# Patient Record
Sex: Male | Born: 2000 | Race: White | Hispanic: No | Marital: Single | Smoking: Never smoker
Health system: Southern US, Community
[De-identification: ages and names within clinical notes are randomized; demographics above are authoritative.]

---
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
## 2020-01-02 ENCOUNTER — Emergency Department (HOSPITAL_COMMUNITY)
Admission: EM | Admit: 2020-01-02 | Discharge: 2020-01-02 | Disposition: A | Discharge status: Home / Self Care | Payer: BC Managed Care – PPO | Attending: Emergency Medicine | Admitting: Emergency Medicine

## 2020-01-02 ENCOUNTER — Other Ambulatory Visit: Payer: Self-pay

## 2020-01-02 ENCOUNTER — Encounter (HOSPITAL_COMMUNITY): Payer: Self-pay | Admitting: Emergency Medicine

## 2020-01-02 DIAGNOSIS — J029 Acute pharyngitis, unspecified: Secondary | ICD-10-CM | POA: Diagnosis present

## 2020-01-02 DIAGNOSIS — J020 Streptococcal pharyngitis: Secondary | ICD-10-CM | POA: Diagnosis not present

## 2020-01-02 MED ORDER — METHYLPREDNISOLONE SODIUM SUCC 125 MG IJ SOLR
125.0000 mg | Freq: Once | INTRAMUSCULAR | Status: AC
  Administered 2020-01-02: 125 mg via INTRAMUSCULAR
  Filled 2020-01-02: qty 2

## 2020-01-02 MED ORDER — CEFTRIAXONE SODIUM 1 G IJ SOLR
1.0000 g | Freq: Once | INTRAMUSCULAR | Status: AC
  Administered 2020-01-02: 13:00:00 1 g via INTRAMUSCULAR
  Filled 2020-01-02: qty 10

## 2020-01-02 MED ORDER — LIDOCAINE HCL (PF) 1 % IJ SOLN
INTRAMUSCULAR | Status: AC
  Administered 2020-01-02: 2 mL
  Filled 2020-01-02: qty 2

## 2020-01-02 MED ORDER — CEPHALEXIN 500 MG PO CAPS: 500.0000 mg | ORAL_CAPSULE | Freq: Four times a day (QID) | ORAL | 0 refills | Status: AC

## 2020-01-02 NOTE — ED Triage Notes (Signed)
Patient was seen on at [facility] on Friday and prescribed antibiotics for a sore throat. Patient complains that fevers persist and throat still hurts.

## 2020-01-02 NOTE — ED Provider Notes (Signed)
[facility] EMERGENCY DEPARTMENT Provider Note   CSN: [id number] Arrival date & time: 01/02/20  1034     History Chief Complaint  Patient presents with  . Sore Throat    Donald Larsen is a 19 y.o. male.  The history is provided by the patient. No language interpreter was used.  Sore Throat This is a new problem. The current episode started more than 2 days ago. The problem occurs constantly. The problem has been gradually worsening. Pertinent negatives include no chest pain and no abdominal pain. Nothing aggravates the symptoms. Nothing relieves the symptoms. The treatment provided moderate relief.  Pt complains of a sore throat.  Pt started on clindamycin at Urgent care.  Pt complains of continued swelling and pain     History reviewed. No pertinent past medical history.  There are no problems to display for this patient.   History reviewed. No pertinent surgical history.     No family history on file.  Social History   Tobacco Use  . Smoking status: Never Smoker  . Smokeless tobacco: Never Used  Substance Use Topics  . Alcohol use: Not on file  . Drug use: Not on file    Home Medications Prior to Admission medications   Medication Sig Start Date End Date Taking? Authorizing Provider  cephALEXin (KEFLEX) 500 MG capsule Take 1 capsule (500 mg total) by mouth 4 (four) times daily for 10 days. 01/02/20 01/12/20  Fransico Meadow, PA-C    Allergies    Amoxicillin and Penicillins  Review of Systems   Review of Systems  Cardiovascular: Negative for chest pain.  Gastrointestinal: Negative for abdominal pain.  All other systems reviewed and are negative.   Physical Exam Updated Vital Signs BP 124/89 (BP Location: Right Arm)   Pulse 86   Temp 98.4 F (36.9 C) (Oral)   Resp 16   Ht 5\' 6"  (1.676 m)   Wt 61.2 kg   SpO2 100%   BMI 21.78 kg/m   Physical Exam Vitals and nursing note reviewed.  Constitutional:      Appearance: He is well-developed.    HENT:     Head: Normocephalic and atraumatic.     Right Ear: Tympanic membrane normal.     Left Ear: Tympanic membrane normal.     Mouth/Throat:     Pharynx: Oropharyngeal exudate and posterior oropharyngeal erythema present.     Tonsils: 2+ on the right. 2+ on the left.  Eyes:     Conjunctiva/sclera: Conjunctivae normal.  Cardiovascular:     Rate and Rhythm: Normal rate and regular rhythm.     Heart sounds: No murmur.  Pulmonary:     Effort: Pulmonary effort is normal. No respiratory distress.     Breath sounds: Normal breath sounds.  Abdominal:     Palpations: Abdomen is soft.     Tenderness: There is no abdominal tenderness.  Musculoskeletal:     Cervical back: Neck supple.  Skin:    General: Skin is warm and dry.  Neurological:     General: No focal deficit present.     Mental Status: He is alert.     ED Results / Procedures / Treatments   Labs (all labs ordered are listed, but only abnormal results are displayed) Labs Reviewed - No data to display  EKG None  Radiology No results found.  Procedures Procedures (including critical care time)  Medications Ordered in ED Medications  cefTRIAXone (ROCEPHIN) injection 1 g (1 g Intramuscular Given 01/02/20 1307)  methylPREDNISolone sodium succinate (SOLU-MEDROL) 125 mg/2 mL injection 125 mg (125 mg Intramuscular Given 01/02/20 1307)  lidocaine (PF) (XYLOCAINE) 1 % injection (2 mLs  Given 01/02/20 1308)    ED Course  I have reviewed the triage vital signs and the nursing notes.  Pertinent labs & imaging results that were available during my care of the patient were reviewed by me and considered in my medical decision making (see chart for details).    MDM Rules/Calculators/A&P                      MDM  Pt had positive strep at [facility] care  Pt given Rocephin 2m. Rx for keflex.  Pt advised to stop clindamycin  Final Clinical Impression(s) / ED Diagnoses Final diagnoses:  Strep throat    Rx / DC Orders ED  Discharge Orders         Ordered    cephALEXin (KEFLEX) 500 MG capsule  4 times daily     01/02/20 1257        An After Visit Summary was printed and given to the patient.    Elson Areas, Cordelia Poche 01/02/20 Luiz Iron    Raeford Razor, MD 01/04/20 [phone]

## 2020-01-02 NOTE — Discharge Instructions (Signed)
Stop clindamycin.  Start cephalexin,  Return if symptoms worsen or change

## 2023-05-22 ENCOUNTER — Other Ambulatory Visit: Payer: Self-pay | Admitting: Oncology

## 2023-05-22 DIAGNOSIS — Z006 Encounter for examination for normal comparison and control in clinical research program: Secondary | ICD-10-CM

## 2023-05-25 ENCOUNTER — Other Ambulatory Visit (HOSPITAL_COMMUNITY)
Admission: RE | Admit: 2023-05-25 | Discharge: 2023-05-25 | Disposition: A | Discharge status: Home / Self Care | Payer: Self-pay | Source: Ambulatory Visit | Attending: Oncology | Admitting: Oncology

## 2023-05-25 DIAGNOSIS — Z006 Encounter for examination for normal comparison and control in clinical research program: Secondary | ICD-10-CM | POA: Insufficient documentation

## 2023-06-02 LAB — GENECONNECT MOLECULAR SCREEN: Genetic Analysis Overall Interpretation: NEGATIVE
